# Patient Record
Sex: Male | Born: 1975 | Race: White | Hispanic: No | Marital: Single
Health system: Southern US, Community
[De-identification: ages and names within clinical notes are randomized; demographics above are authoritative.]

---

## 2004-08-10 ENCOUNTER — Emergency Department: Payer: Self-pay | Admitting: Emergency Medicine

## 2007-09-11 ENCOUNTER — Other Ambulatory Visit: Payer: Self-pay

## 2007-09-11 ENCOUNTER — Emergency Department: Payer: Self-pay | Admitting: Emergency Medicine

## 2008-09-14 ENCOUNTER — Ambulatory Visit: Payer: Self-pay | Admitting: Internal Medicine

## 2008-11-30 ENCOUNTER — Ambulatory Visit: Payer: Self-pay | Admitting: Orthopedic Surgery

## 2008-12-11 ENCOUNTER — Ambulatory Visit: Payer: Self-pay | Admitting: Orthopedic Surgery

## 2012-06-03 ENCOUNTER — Ambulatory Visit: Payer: Self-pay | Admitting: Gastroenterology

## 2012-10-19 ENCOUNTER — Emergency Department: Payer: Self-pay | Admitting: Emergency Medicine

## 2013-06-16 ENCOUNTER — Ambulatory Visit: Payer: Self-pay | Admitting: Orthopedic Surgery

## 2018-03-07 ENCOUNTER — Other Ambulatory Visit: Payer: Self-pay | Admitting: Internal Medicine

## 2018-03-07 DIAGNOSIS — R1012 Left upper quadrant pain: Secondary | ICD-10-CM

## 2018-03-15 ENCOUNTER — Ambulatory Visit: Payer: BLUE CROSS/BLUE SHIELD

## 2018-07-19 ENCOUNTER — Other Ambulatory Visit: Payer: Self-pay | Admitting: Internal Medicine

## 2018-07-19 DIAGNOSIS — R1032 Left lower quadrant pain: Secondary | ICD-10-CM

## 2019-12-28 ENCOUNTER — Ambulatory Visit: Payer: BLUE CROSS/BLUE SHIELD | Attending: Internal Medicine

## 2019-12-28 DIAGNOSIS — Z23 Encounter for immunization: Secondary | ICD-10-CM

## 2019-12-28 NOTE — Progress Notes (Signed)
   Covid-[facility] Vaccination [facility]  Name:  Thomas Barker    MRN: [id number] DOB: 1976/08/07  12/28/2019  [name] was observed post Covid-19 immunization for 15 minutes without incident. He was provided with Vaccine Information Sheet and instruction to access the V-Safe system.   [name] was instructed to call 911 with any severe reactions post vaccine: Marland Kitchen Difficulty breathing  . Swelling of face and throat  . A fast heartbeat  . A bad rash all over body  . Dizziness and weakness   Immunizations Administered    Name Date Dose VIS Date Route   Pfizer COVID-19 Vaccine 12/28/2019 11:46 AM 0.3 mL 09/19/2019 Intramuscular   Manufacturer: [employer], [employer]   Lot: [id number]   NDC: [address]-9

## 2020-01-18 ENCOUNTER — Ambulatory Visit: Payer: Self-pay | Attending: Internal Medicine

## 2020-01-18 DIAGNOSIS — Z23 Encounter for immunization: Secondary | ICD-10-CM

## 2020-01-18 NOTE — Progress Notes (Signed)
   Covid-19 Vaccination Clinic  Name:  SHRAVAN SALAHUDDIN    MRN: [id number] DOB: 04-30-1976  01/18/2020  Mr. Alvis was observed post Covid-19 immunization for 15 minutes without incident. He was provided with Vaccine Information Sheet and instruction to access the V-Safe system.   Mr. Gloria was instructed to call 911 with any severe reactions post vaccine: Marland Kitchen Difficulty breathing  . Swelling of face and throat  . A fast heartbeat  . A bad rash all over body  . Dizziness and weakness   Immunizations Administered    Name Date Dose VIS Date Route   Pfizer COVID-19 Vaccine 01/18/2020 11:06 AM 0.3 mL 09/19/2019 Intramuscular   Manufacturer: [employer], [employer]   Lot: [id number]   NDC: [address]-3

## 2020-07-13 ENCOUNTER — Other Ambulatory Visit: Payer: Self-pay | Admitting: Internal Medicine

## 2020-07-13 DIAGNOSIS — R1310 Dysphagia, unspecified: Secondary | ICD-10-CM

## 2020-08-02 ENCOUNTER — Ambulatory Visit
Admission: RE | Admit: 2020-08-02 | Discharge: 2020-08-02 | Disposition: A | Discharge status: Home / Self Care | Payer: BC Managed Care – PPO | Source: Ambulatory Visit | Attending: Internal Medicine | Admitting: Internal Medicine

## 2020-08-02 ENCOUNTER — Other Ambulatory Visit: Payer: Self-pay

## 2020-08-02 DIAGNOSIS — R1310 Dysphagia, unspecified: Secondary | ICD-10-CM | POA: Insufficient documentation

## 2022-04-13 IMAGING — RF DG ESOPHAGUS
10 of 11 series · 14 of 21 positions shown · non-contrast
Comparison: None.

CLINICAL DATA: History of acid reflux, difficulty swallowing,
sensation of fullness in throat

EXAM:
ESOPHOGRAM / BARIUM SWALLOW / BARIUM TABLET STUDY
TECHNIQUE: Combined double contrast and single contrast examination performed
using effervescent crystals, thick barium liquid, and thin barium
liquid. The patient was observed with fluoroscopy swallowing a 13 mm
barium sulphate tablet.
FLUOROSCOPY TIME:  Fluoroscopy Time:  [DATE]
Number of Acquired Spot Images: 24

[Series 1: fluoro_barium 2fps_bw · 0.17mm/px · 2 of 4 frames shown (1 of 10)]
[frame 1/4]
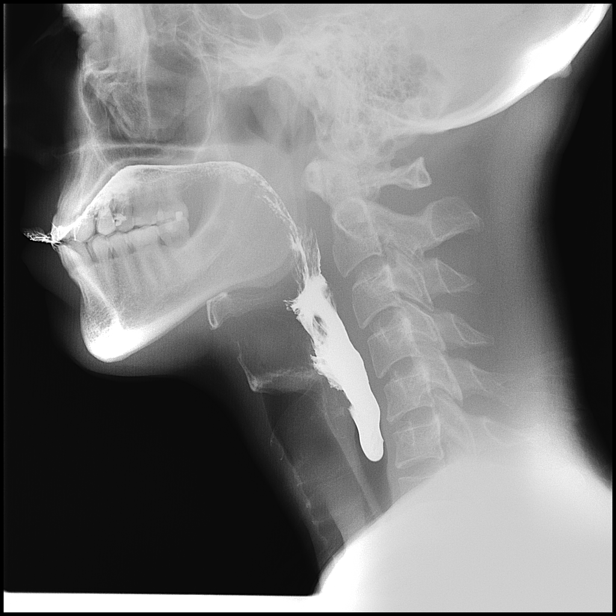
[frame 4/4]
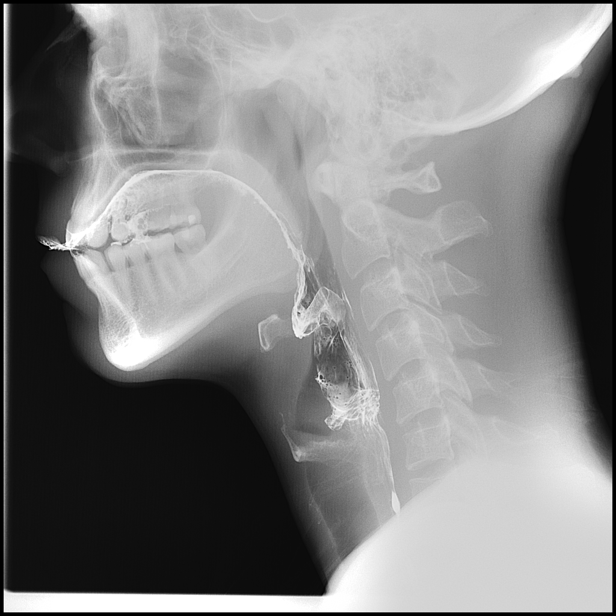

[Series 2: fluoro_barium 2fps_bw · 0.17mm/px · 1 of 1 slices shown (2 of 10)]
[im 1/1]
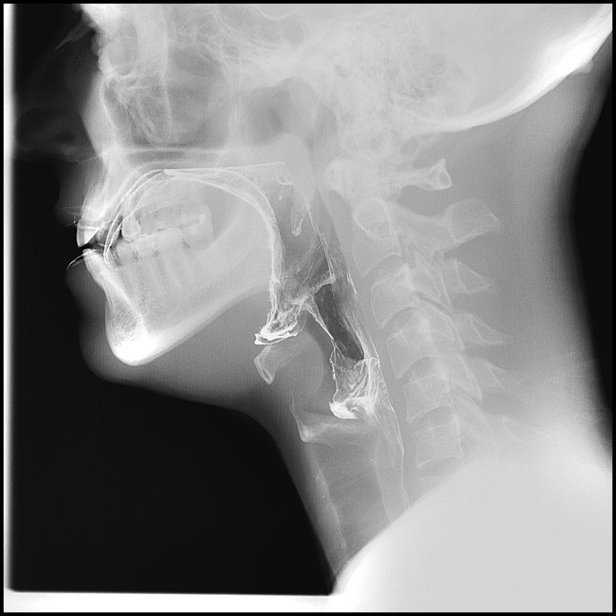

[Series 3: fluoro_barium 2fps_bw · 0.17mm/px · 2 of 5 frames shown (3 of 10)]
[frame 3/5]
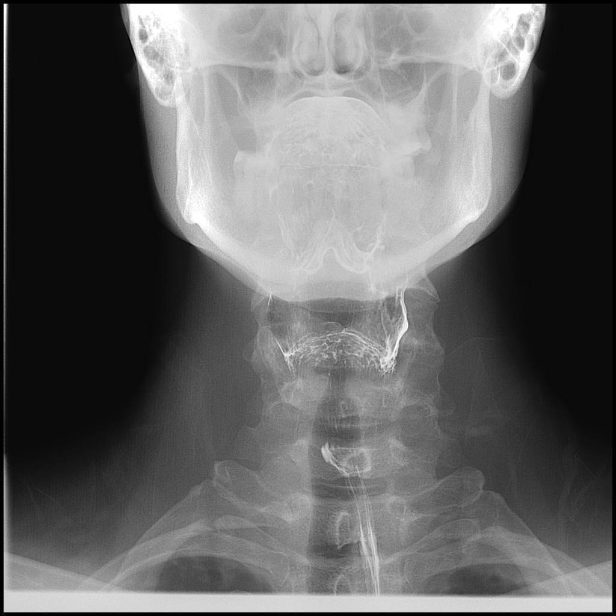
[frame 5/5]
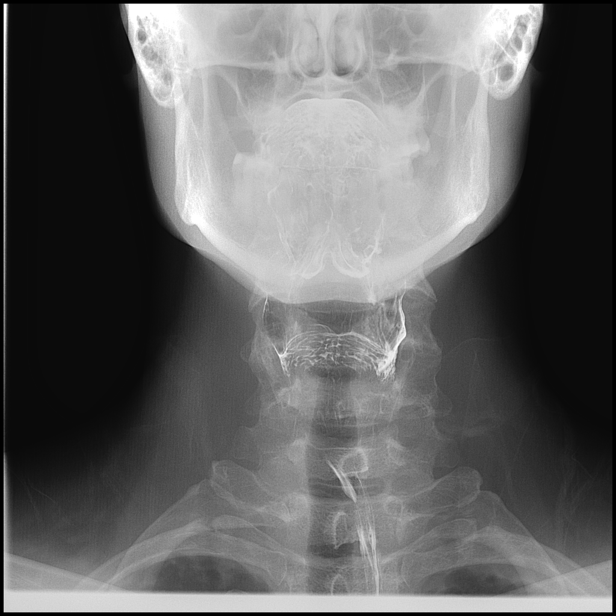

[Series 4: fluoro_barium 2fps_bw · 0.17mm/px · 1 of 2 frames shown (4 of 10)]
[frame 2/2]
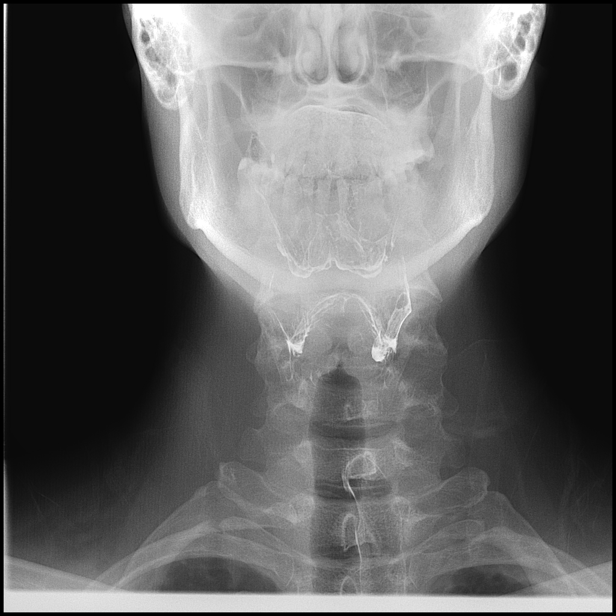

[Series 5: fluoro_barium 2fps_bw · 0.17mm/px · 1 of 2 frames shown (5 of 10)]
[frame 1/2]
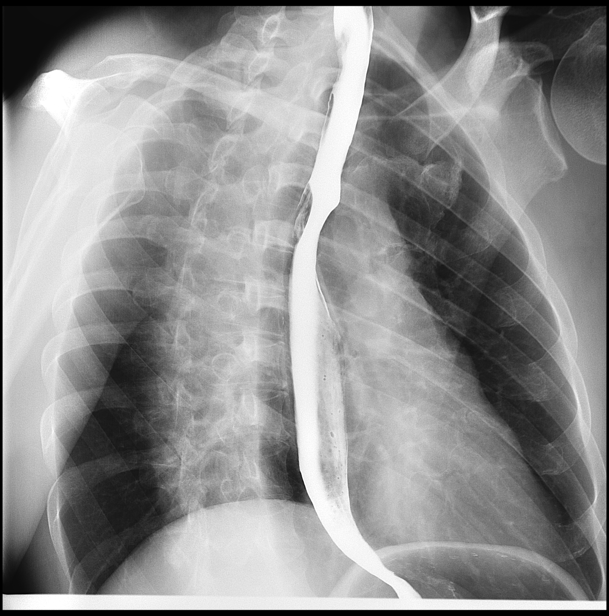

[Series 6: fluoro_barium 2fps_bw · 0.17mm/px · 2 of 2 frames shown (6 of 10)]
[frame 1/2]
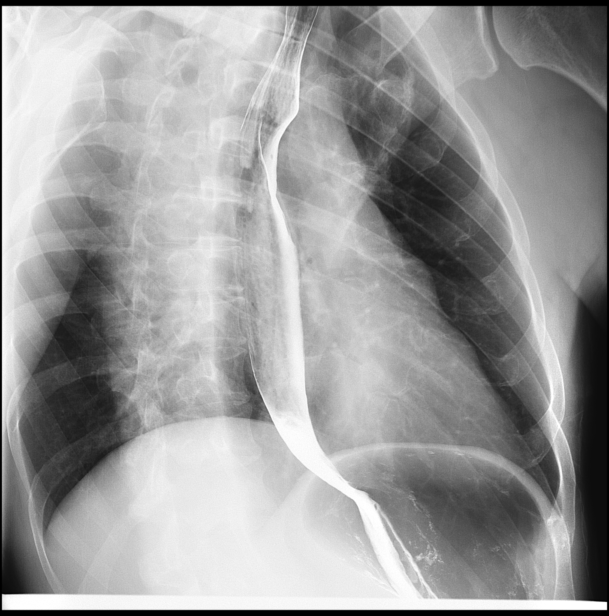
[frame 2/2]
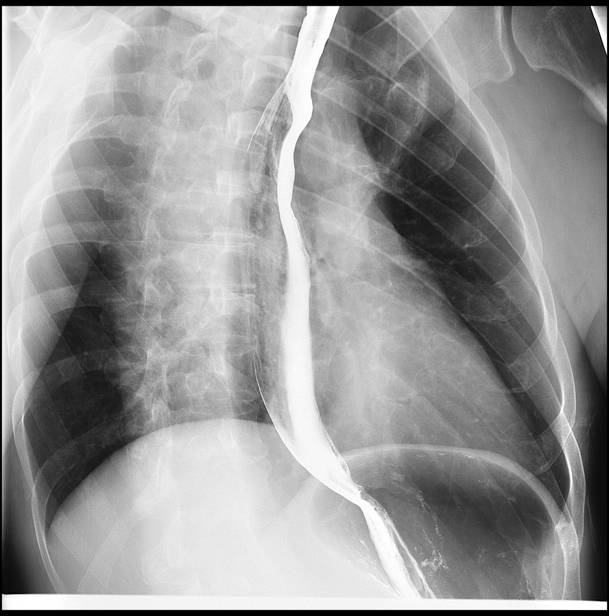

[Series 7: fluoro_barium 2fps_bw · 0.17mm/px · 1 of 2 frames shown (7 of 10)]
[frame 2/2]
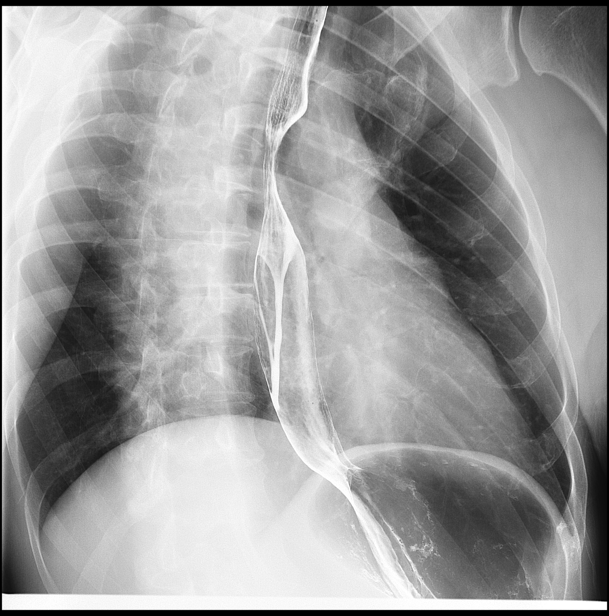

[Series 8: fluoro_barium 2fps_bw · 0.17mm/px · 1 of 2 frames shown (8 of 10)]
[frame 1/2]
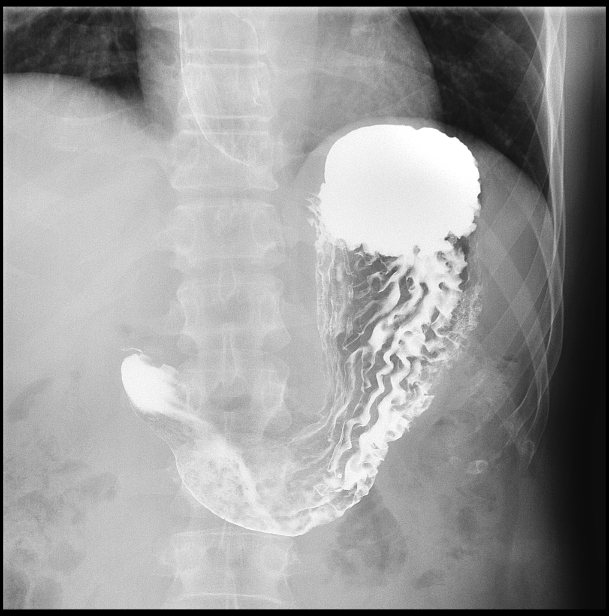

[Series 9: fluoro_barium 2fps_bw · 0.18mm/px · 2 of 2 frames shown (9 of 10)]
[frame 1/2]
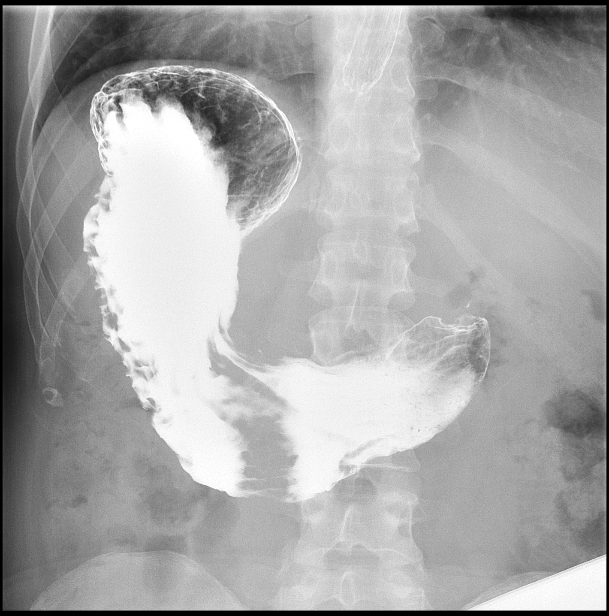
[frame 2/2]
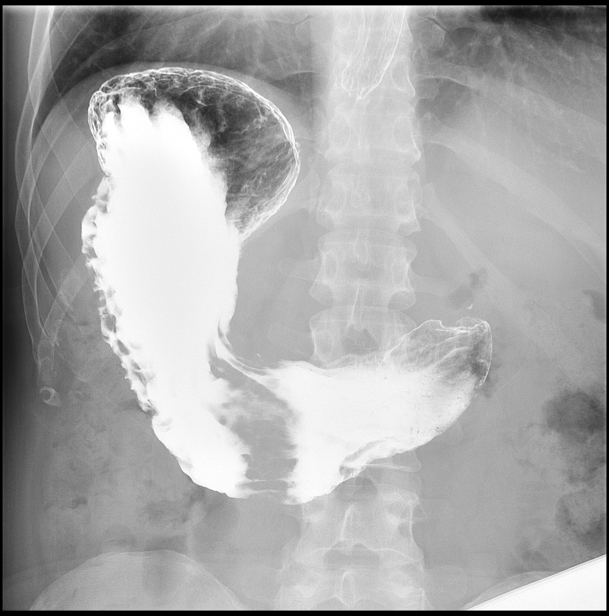

[Series 11: fluoro_barium 2fps_bw · 0.18mm/px · 1 of 1 slices shown (10 of 10)]
[im 1/1]
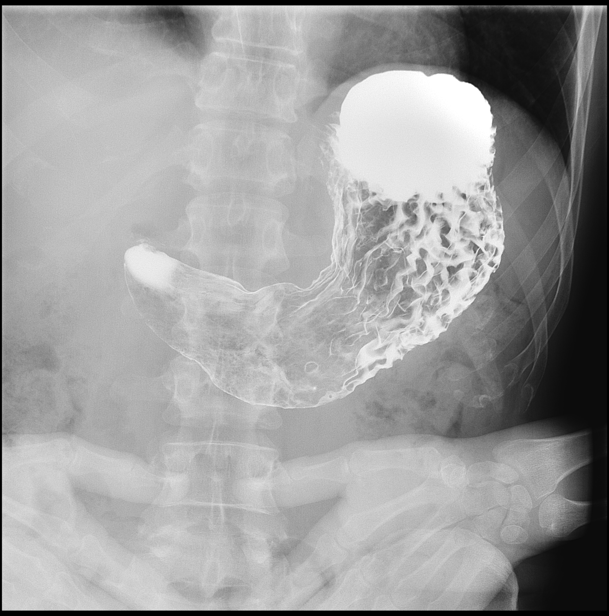

[14 of 21 positions shown; findings below may reference images not displayed]

FINDINGS: Normal oropharyngeal phase of swallow. No evidence of penetration or
aspiration. Normal appearance of the oropharynx.

Normal contour and caliber of the esophagus. A swallowed 13 mm
barium tablet passes readily through the gastroesophageal junction.
No evidence of reflux with provocation maneuvers.

Normal double contrast appearance of the stomach. The proximal small
bowel is not opacified.
IMPRESSION: 1. Normal double contrast upper GI examination. No findings to
explain dysphagia or sensation of fullness in the throat.

2. No significant reflux observed during the course of this
examination.

## 2022-09-14 ENCOUNTER — Other Ambulatory Visit: Payer: Self-pay | Admitting: Physician Assistant

## 2022-09-14 DIAGNOSIS — G8929 Other chronic pain: Secondary | ICD-10-CM

## 2022-11-10 ENCOUNTER — Encounter: Payer: Self-pay | Admitting: Physician Assistant

## 2022-11-13 ENCOUNTER — Inpatient Hospital Stay: Admission: RE | Admit: 2022-11-13 | Payer: BC Managed Care – PPO | Source: Ambulatory Visit

## 2022-11-13 ENCOUNTER — Ambulatory Visit: Payer: BC Managed Care – PPO

## 2022-11-17 ENCOUNTER — Ambulatory Visit
Admission: RE | Admit: 2022-11-17 | Discharge: 2022-11-17 | Disposition: A | Discharge status: Home / Self Care | Payer: BC Managed Care – PPO | Source: Ambulatory Visit | Attending: Physician Assistant | Admitting: Physician Assistant

## 2022-11-17 DIAGNOSIS — G8929 Other chronic pain: Secondary | ICD-10-CM

## 2022-11-20 ENCOUNTER — Other Ambulatory Visit: Payer: BC Managed Care – PPO
